# Patient Record
Sex: Female | Born: 1965 | Race: Black or African American | Marital: Married | Smoking: Never smoker
Health system: Southern US, Community
[De-identification: ages and names within clinical notes are randomized; demographics above are authoritative.]

## PROBLEM LIST (undated history)

## (undated) DIAGNOSIS — E785 Hyperlipidemia, unspecified: Secondary | ICD-10-CM

## (undated) DIAGNOSIS — I10 Essential (primary) hypertension: Secondary | ICD-10-CM

## (undated) HISTORY — PX: TUBAL LIGATION: SHX77

## (undated) HISTORY — PX: ABDOMINAL HYSTERECTOMY: SHX81

## (undated) HISTORY — PX: HERNIA REPAIR: SHX51

## (undated) HISTORY — PX: CHOLECYSTECTOMY: SHX55

## (undated) HISTORY — PX: TONSILLECTOMY: SHX5217

---

## 2013-12-22 ENCOUNTER — Emergency Department (HOSPITAL_COMMUNITY)
Admission: EM | Admit: 2013-12-22 | Discharge: 2013-12-22 | Disposition: A | Discharge status: Home / Self Care | Payer: 59 | Attending: Emergency Medicine | Admitting: Emergency Medicine

## 2013-12-22 ENCOUNTER — Encounter (HOSPITAL_COMMUNITY): Payer: Self-pay | Admitting: Emergency Medicine

## 2013-12-22 DIAGNOSIS — Z79899 Other long term (current) drug therapy: Secondary | ICD-10-CM | POA: Insufficient documentation

## 2013-12-22 DIAGNOSIS — Z880 Allergy status to penicillin: Secondary | ICD-10-CM | POA: Insufficient documentation

## 2013-12-22 DIAGNOSIS — R21 Rash and other nonspecific skin eruption: Secondary | ICD-10-CM | POA: Insufficient documentation

## 2013-12-22 DIAGNOSIS — I10 Essential (primary) hypertension: Secondary | ICD-10-CM | POA: Insufficient documentation

## 2013-12-22 DIAGNOSIS — L299 Pruritus, unspecified: Secondary | ICD-10-CM | POA: Insufficient documentation

## 2013-12-22 HISTORY — DX: Essential (primary) hypertension: I10

## 2013-12-22 MED ORDER — HYDROCORTISONE 2.5 % EX LOTN: TOPICAL_LOTION | Freq: Two times a day (BID) | CUTANEOUS | Status: AC

## 2013-12-22 MED ORDER — PREDNISONE 20 MG PO TABS: 60.0000 mg | ORAL_TABLET | Freq: Every day | ORAL | Status: AC

## 2013-12-22 NOTE — ED Notes (Signed)
C/o rash (fine, raised, dry bumps to bilateral upper and lower extremities), reports itching and pain. Onset last Wednesday, seen at urgent care on Saturday, prescribed prednisone, but new insurance has not started yet so did not sart prescribed prednisone, but did start some prednisone that she had around the house, (taking sporadically, not sure of dose), no relief with benadryl. Has had ~ 8 hrs of sleep in last week d/t itching/pain. Has also tried hydrocortisone w/o relief. Denies fever or other sx.  [facility] [place] [facility], has been here for ~ 2 months, "things are going fairly well", "thinks rash might be d/t stress". [facility].

## 2013-12-22 NOTE — ED Notes (Signed)
Out with daughter to d/c desk, given Rx x2, steady gait. Denies questions, needs sx or concerns unmet.

## 2013-12-22 NOTE — ED Notes (Signed)
The pt has had a rash over her body for one week.  No one else in the house has that rpoblem

## 2013-12-22 NOTE — ED Provider Notes (Signed)
CSN: [id number]     Arrival date & time 12/22/13  2107 History  This chart was scribed for non-physician practitioner, Santiago GladHeather Marciel Offenberger, PA-C working with Hilario Quarryanielle S Ray, MD by Luisa DagoPriscilla Tutu, ED scribe. This patient was seen in room TR06C/TR06C and the patient's care was started at 11:10 PM.    Chief Complaint  Patient presents with  . Rash    The history is provided by the patient. No language interpreter was used.   HPI Comments: Sydney MalayConsandra Brewer is a 48 y.o. female who presents to the Emergency Department complaining of a constant rash that started 1 week ago. Pt states that the rash is located on both her arms, legs, and chest. She states that the rash flares up from time to time with associated symptoms of itching and burning. She reports taking some left over Prednisone once a day with no relief. She also took benadryl and applied a 1% hydrocortisone ointment to the rash with no relief. Pt denies any recent exposure to new chemicals or materials. She denies any swelling of the lips, throat, or tongue. No fever or chills. No SOB.   No known contacts with similar rash.   Past Medical History  Diagnosis Date  . Hypertension    History reviewed. No pertinent past surgical history. No family history on file. History  Substance Use Topics  . Smoking status: Never Smoker   . Smokeless tobacco: Not on file  . Alcohol Use: No   OB History   Grav Para Term Preterm Abortions TAB SAB Ect Mult Living                 Review of Systems  Constitutional: Negative for fever and chills.  Gastrointestinal: Negative for nausea and vomiting.  Skin: Positive for rash.  Neurological: Negative for headaches.    Allergies  Penicillins  Home Medications   Current Outpatient Rx  Name  Route  Sig  Dispense  Refill  . Biotin 5000 MCG CAPS   Oral   Take 5,000 mcg by mouth daily.         . diphenhydrAMINE (BENADRYL) 25 MG tablet   Oral   Take 25 mg by mouth at bedtime as needed for  itching.         . fluticasone (FLONASE) 50 MCG/ACT nasal spray   Each Nare   Place 2 sprays into both nostrils daily as needed for allergies or rhinitis.         . hydrocortisone cream 1 %   Topical   Apply 1 application topically 2 (two) times daily as needed for itching.         Marland Kitchen. lisinopril (PRINIVIL,ZESTRIL) 10 MG tablet   Oral   Take 10 mg by mouth daily.         . meloxicam (MOBIC) 15 MG tablet   Oral   Take 15 mg by mouth daily as needed for pain.          Triage vitals:BP 147/96  Pulse 80  Temp(Src) 98.1 F (36.7 C) (Oral)  Resp 18  SpO2 98%  Physical Exam  Nursing note and vitals reviewed. Constitutional: She appears well-developed and well-nourished. No distress.  HENT:  Head: Normocephalic and atraumatic.  Mouth/Throat: Oropharynx is clear and moist. No oropharyngeal exudate.  Eyes: Conjunctivae are normal. Right eye exhibits no discharge. Left eye exhibits no discharge.  Neck: Normal range of motion. Neck supple.  Cardiovascular: Normal rate, regular rhythm and normal heart sounds.  Exam reveals no gallop  and no friction rub.   No murmur heard. Pulmonary/Chest: Effort normal and breath sounds normal. No respiratory distress.  Musculoskeletal: She exhibits no edema and no tenderness.  Neurological: She is alert.  Skin: Skin is warm and dry. Rash noted. No petechiae and no purpura noted.  Flesh colored diffuse papulular rash on her arms, legs, face, and chest.  Psychiatric: She has a normal mood and affect. Her behavior is normal. Thought content normal.    ED Course  Procedures (including critical care time)  DIAGNOSTIC STUDIES: Oxygen Saturation is 98% on RA, normal by my interpretation.    COORDINATION OF CARE: 11:23 PM- Will prescribe a 2.5% hydrocortisone cream and prednisone. Pt advised of plan for treatment and pt agrees.  Labs Review Labs Reviewed - No data to display Imaging Review No results found.  EKG Interpretation   None        MDM  No diagnosis found. Patient presenting with a diffuse papular rash located on both arms, both legs, and chest.  Oropharynx is clear and moist.  No SOB.  No swelling of lips, tongue, or throat.  Patient afebrile.  Feel that the patient is stable for discharge.  Return precautions given.  I personally performed the services described in this documentation, which was scribed in my presence. The recorded information has been reviewed and is accurate.    Santiago Glad, PA-C 12/23/13 1037

## 2013-12-23 NOTE — ED Provider Notes (Signed)
History/physical exam/procedure(s) were performed by non-physician practitioner and as supervising physician I was immediately available for consultation/collaboration. I have reviewed all notes and am in agreement with care and plan.   Hilario Quarryanielle S Maude Hettich, MD 12/23/13 [phone]

## 2014-04-12 ENCOUNTER — Other Ambulatory Visit: Payer: Self-pay | Admitting: Internal Medicine

## 2014-04-12 DIAGNOSIS — R319 Hematuria, unspecified: Secondary | ICD-10-CM

## 2014-04-16 ENCOUNTER — Ambulatory Visit
Admission: RE | Admit: 2014-04-16 | Discharge: 2014-04-16 | Disposition: A | Discharge status: Home / Self Care | Payer: 59 | Source: Ambulatory Visit | Attending: Internal Medicine | Admitting: Internal Medicine

## 2014-04-16 DIAGNOSIS — R319 Hematuria, unspecified: Secondary | ICD-10-CM

## 2014-04-16 HISTORY — DX: Hyperlipidemia, unspecified: E78.5

## 2014-05-07 ENCOUNTER — Other Ambulatory Visit: Payer: Self-pay | Admitting: Internal Medicine

## 2014-05-07 DIAGNOSIS — R3129 Other microscopic hematuria: Secondary | ICD-10-CM

## 2014-05-22 ENCOUNTER — Ambulatory Visit: Payer: 59

## 2014-05-29 ENCOUNTER — Ambulatory Visit: Payer: 59

## 2014-06-05 ENCOUNTER — Ambulatory Visit: Payer: 59

## 2014-11-21 IMAGING — US US RENAL
1 series · 14 of 25 positions shown · non-contrast
Comparison: None.

CLINICAL DATA: Microscopic hematuria

EXAM:
RENAL/URINARY TRACT ULTRASOUND COMPLETE

[Series 1: us renal · 0.31mm/px · 37 acquisitions, 14 frames shown]
[im 1/37]
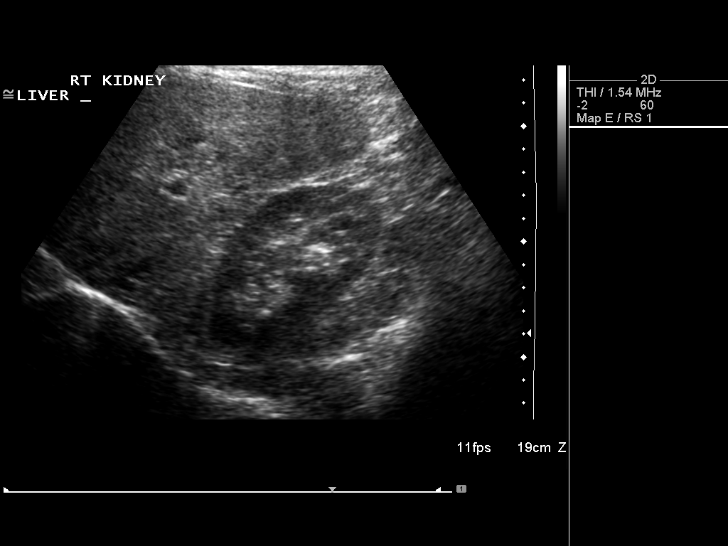
[im 4/37]
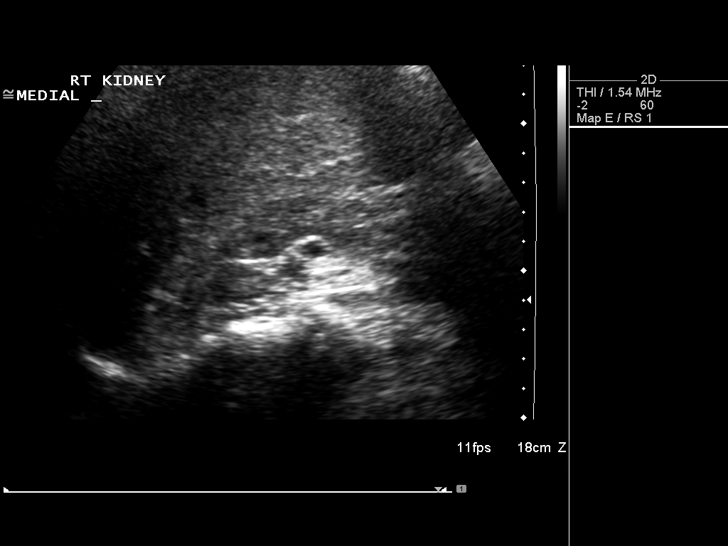
[im 7/37]
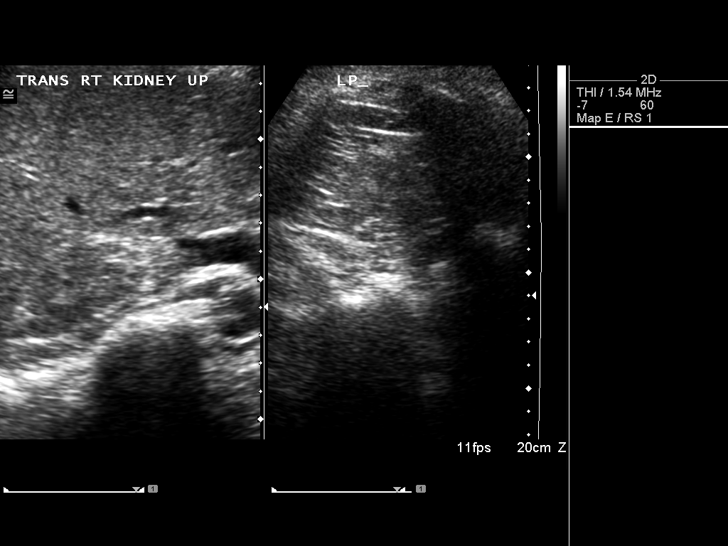
[im 10/37]
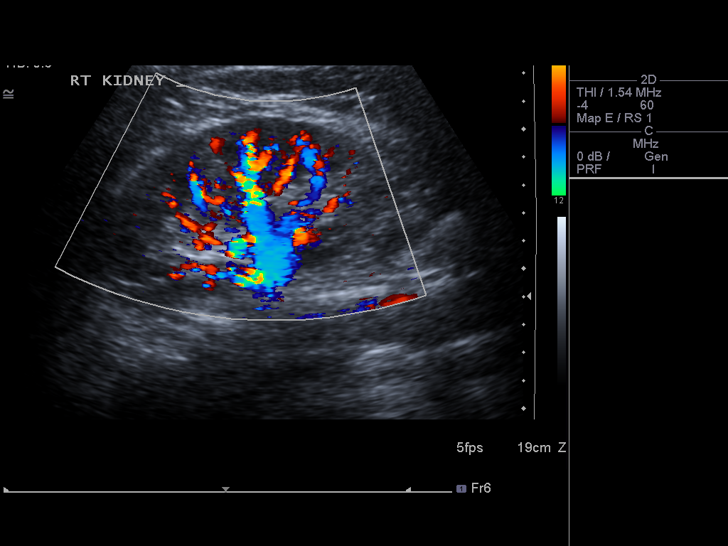
[im 13/37]
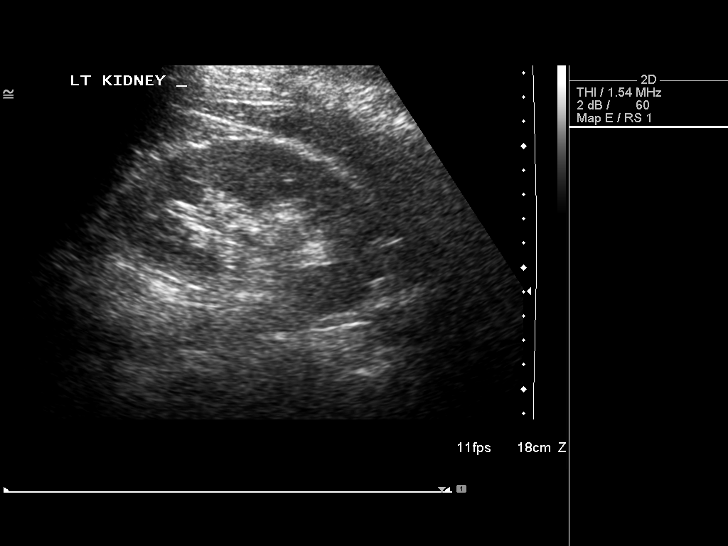
[im 14/37]
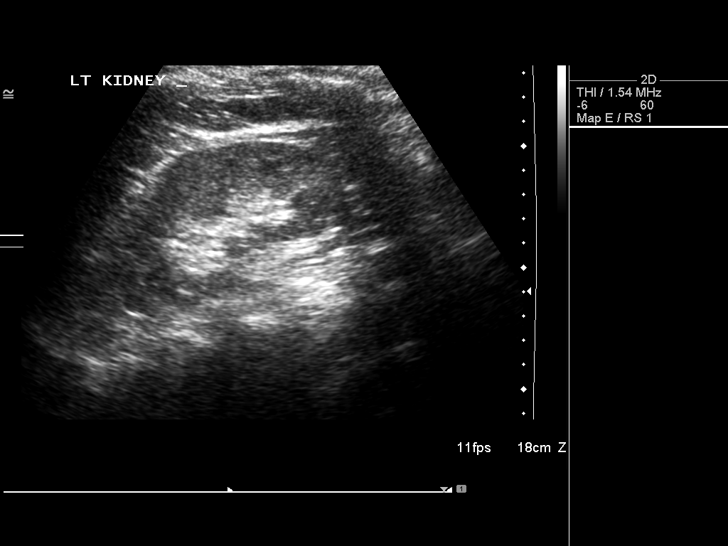
[im 17/37]
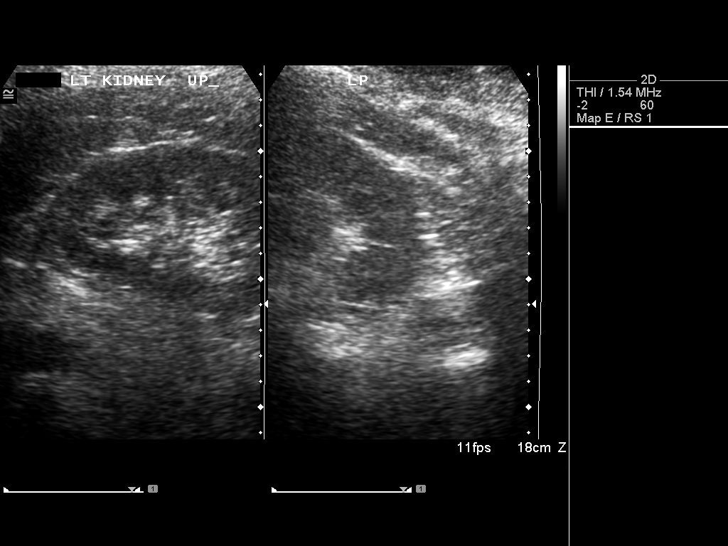
[im 20/37]
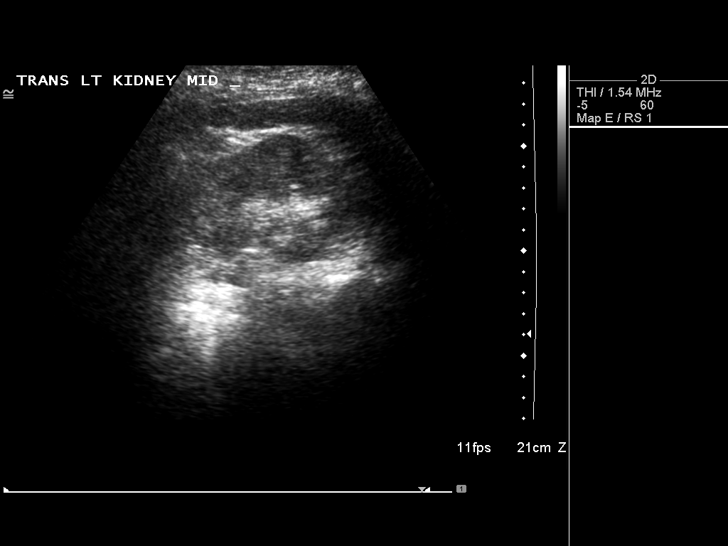
[im 23/37]
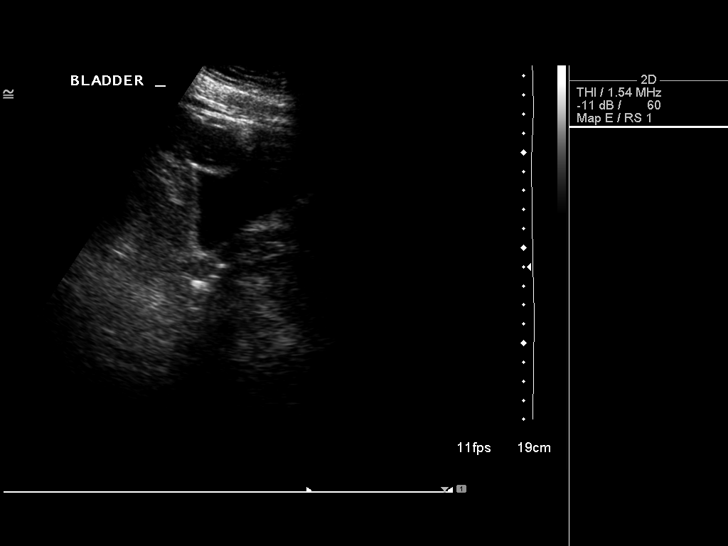
[im 25/37]
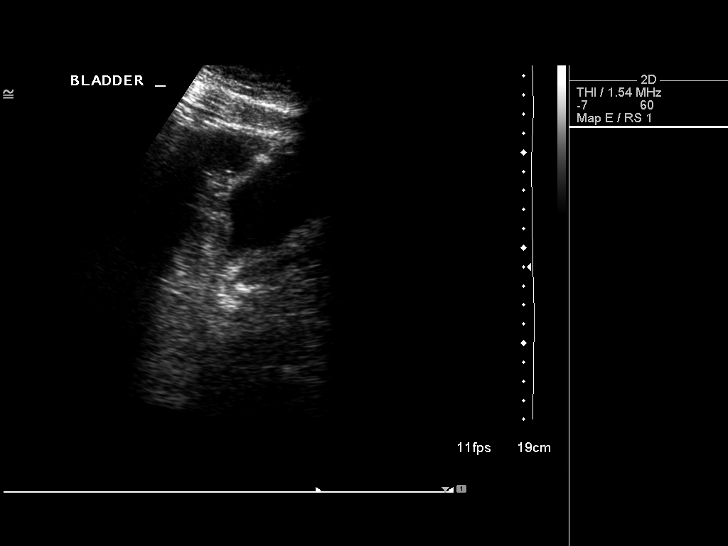
[im 28/37]
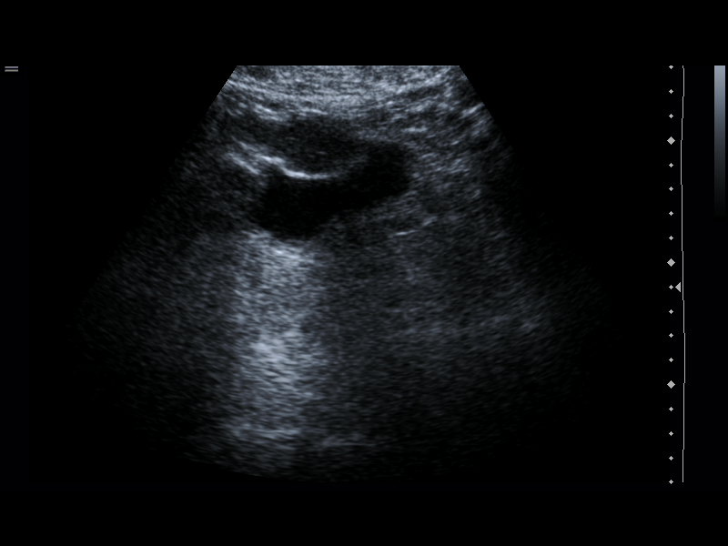
[im 31/37]
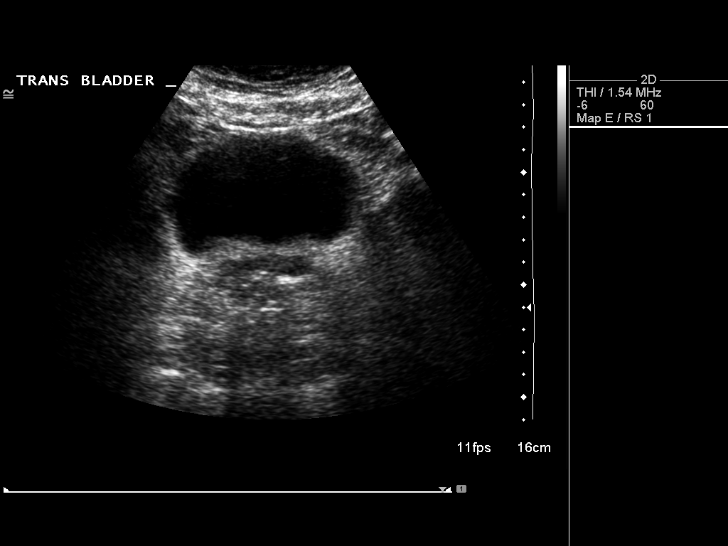
[im 34/37]
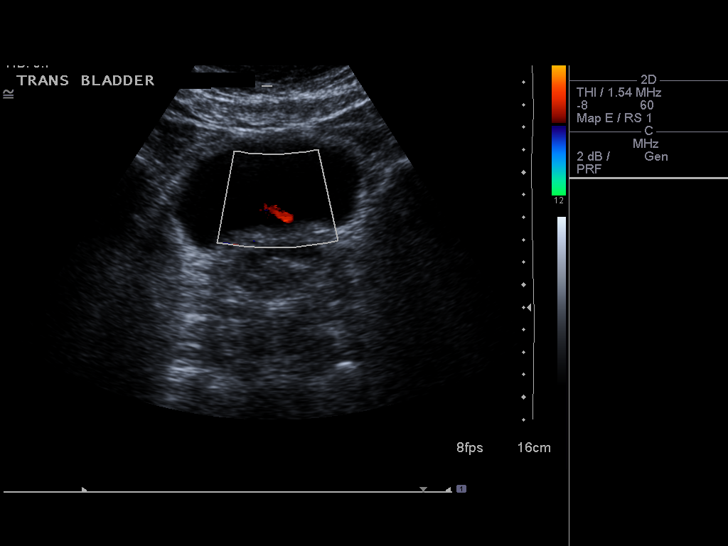
[im 37/37]
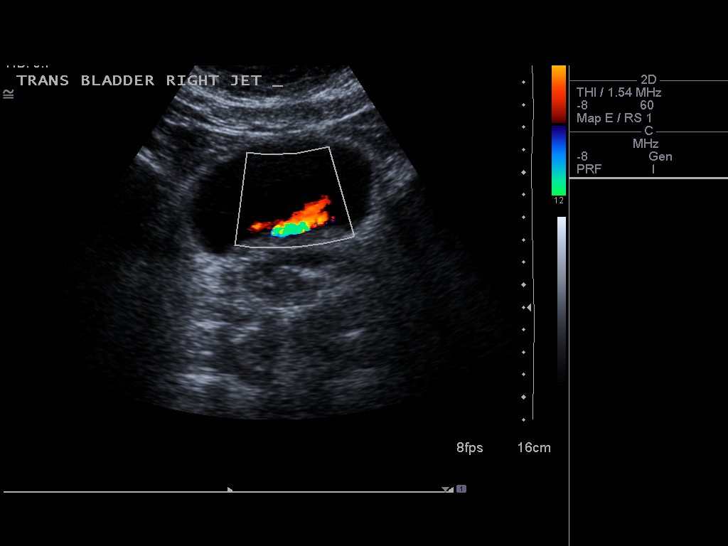

[14 of 25 positions shown; findings below may reference images not displayed]

FINDINGS: Right Kidney:

Length: 9.9 cm..  No hydronephrosis is seen.

Left Kidney:

Length: 10.9 cm..  No hydronephrosis is noted.

Bladder:

Yaishierette urinary bladder is moderately urine distended. No abnormality is
seen. Bilateral ureteral jets are visualized.
IMPRESSION: 1. No hydronephrosis.
2. Bilateral ureteral jets are noted.
